# Patient Record
Sex: Male | Born: 1995 | Race: Black or African American | Hispanic: No | Marital: Single | Smoking: Never smoker
Health system: Southern US, Community
[De-identification: ages and names within clinical notes are randomized; demographics above are authoritative.]

---
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
## 2013-05-07 ENCOUNTER — Telehealth: Payer: Self-pay | Admitting: Pediatrics

## 2013-05-07 NOTE — Telephone Encounter (Signed)
Pt would like to know if you will accept him as a pt. Mom and dad are both pt's of yours.

## 2013-05-07 NOTE — Telephone Encounter (Signed)
Sorry but I am too full to take him on

## 2013-05-10 NOTE — Telephone Encounter (Signed)
Mom aware.

## 2013-06-04 ENCOUNTER — Ambulatory Visit: Payer: Commercial Managed Care - PPO | Admitting: Family Medicine

## 2015-10-09 ENCOUNTER — Encounter (HOSPITAL_COMMUNITY): Payer: Self-pay | Admitting: Emergency Medicine

## 2015-10-09 ENCOUNTER — Emergency Department (HOSPITAL_COMMUNITY)
Admission: EM | Admit: 2015-10-09 | Discharge: 2015-10-09 | Disposition: A | Discharge status: Home / Self Care | Payer: Commercial Managed Care - PPO | Attending: Emergency Medicine | Admitting: Emergency Medicine

## 2015-10-09 DIAGNOSIS — R45851 Suicidal ideations: Secondary | ICD-10-CM | POA: Diagnosis present

## 2015-10-09 DIAGNOSIS — Z046 Encounter for general psychiatric examination, requested by authority: Secondary | ICD-10-CM | POA: Insufficient documentation

## 2015-10-09 DIAGNOSIS — Z008 Encounter for other general examination: Secondary | ICD-10-CM

## 2015-10-09 MED ORDER — ZOLPIDEM TARTRATE 5 MG PO TABS: 5.0000 mg | ORAL_TABLET | Freq: Every evening | ORAL | Status: DC | PRN

## 2015-10-09 MED ORDER — AMPHETAMINE-DEXTROAMPHETAMINE 10 MG PO TABS: 10.0000 mg | ORAL_TABLET | Freq: Every day | ORAL | Status: DC

## 2015-10-09 MED ORDER — ONDANSETRON HCL 4 MG PO TABS: 4.0000 mg | ORAL_TABLET | Freq: Three times a day (TID) | ORAL | Status: DC | PRN

## 2015-10-09 MED ORDER — LORAZEPAM 1 MG PO TABS: 1.0000 mg | ORAL_TABLET | Freq: Three times a day (TID) | ORAL | Status: DC | PRN

## 2015-10-09 MED ORDER — ACETAMINOPHEN 325 MG PO TABS: 650.0000 mg | ORAL_TABLET | ORAL | Status: DC | PRN

## 2015-10-09 MED ORDER — ALUM & MAG HYDROXIDE-SIMETH 200-200-20 MG/5ML PO SUSP: 30.0000 mL | ORAL | Status: DC | PRN

## 2015-10-09 MED ORDER — IBUPROFEN 200 MG PO TABS: 600.0000 mg | ORAL_TABLET | Freq: Three times a day (TID) | ORAL | Status: DC | PRN

## 2015-10-09 NOTE — ED Provider Notes (Signed)
[facility] Provider Note   CSN: [id number] Arrival date & time: 10/09/15  2053     History   Chief Complaint Chief Complaint  Patient presents with  . Medical Clearance    HPI James Murray is a 20 y.o. male.  The history is provided by the patient.  He was brought in by police because of concerns of bouts with suicidal ideation. He came in voluntarily and is not under IVC. Apparently, someone saw an email he had written which seem to suggest that he was having suicidal thoughts. Patient states that he was momentarily under stress and wrote something that could've been construed have by someone not familiar with him as indicating suicidal thoughts. There was never any suicidal plan identified. Patient states that this was just written under this is strain of the moment and he never actually had suicidal thoughts. He denies depression. He denies of crying spells, early morning awakening, anhedonia. He denies hallucinations. He has no history of depression.  History reviewed. No pertinent past medical history.  There are no active problems to display for this patient.   History reviewed. No pertinent surgical history.     Home Medications    Prior to Admission medications   Medication Sig Start Date End Date Taking? Authorizing Provider  amphetamine-dextroamphetamine (ADDERALL) 10 MG tablet Take 1 tablet by mouth daily. 07/09/15  Yes Historical Provider, MD    Family History No family history on file.  Social History Social History  Substance Use Topics  . Smoking status: Never Smoker  . Smokeless tobacco: Never Used  . Alcohol use Yes     Comment: rarely     Allergies   Review of patient's allergies indicates no known allergies.   Review of Systems Review of Systems  All other systems reviewed and are negative.    Physical Exam Updated Vital Signs BP 143/85 (BP Location: Right Arm)   Pulse 77   Temp 98.1 F (36.7 C) (Oral)   Resp 16    Ht 5\' 6"  (1.676 m)   Wt 160 lb (72.6 kg)   SpO2 100%   BMI 25.82 kg/m   Physical Exam  Nursing note and vitals reviewed.  20 year old male, resting comfortably and in no acute distress. Vital signs are significant for hypertension. Oxygen saturation is 100%, which is normal. Head is normocephalic and atraumatic. PERRLA, EOMI. Oropharynx is clear. Neck is nontender and supple without adenopathy or JVD. Back is nontender and there is no CVA tenderness. Lungs are clear without rales, wheezes, or rhonchi. Chest is nontender. Heart has regular rate and rhythm without murmur. Abdomen is soft, flat, nontender without masses or hepatosplenomegaly and peristalsis is normoactive. Extremities have no cyanosis or edema, full range of motion is present. Skin is warm and dry without rash. Neurologic: Mental status is normal, cranial nerves are intact, there are no motor or sensory deficits. Psychiatric: Completely normal affect. No evidence of depression.  ED Treatments / Results   Procedures Procedures (including critical care time)  Medications Ordered in ED Medications - No data to display   Initial Impression / Assessment and Plan / ED Course  I have reviewed the triage vital signs and the nursing notes.   Clinical Course    Patient brought in for evaluation for possible suicidal ideation. Exam shows completely normal mental status. No evidence of depression or suicidal thoughts. He is discharged with instructions to follow-up with PCP.  Final Clinical Impressions(s) / ED Diagnoses   Final  diagnoses:  Encounter for psychological evaluation    New Prescriptions Discharge Medication List as of 10/09/2015 11:21 PM       Dione Boozeavid Jager Koska, MD 10/09/15 [phone]

## 2015-10-09 NOTE — ED Triage Notes (Signed)
Pt here with [employer] following writing a letter expressing SI to a student center. The student center contacted [employer] who brought the pt here. Pt denies SI, and HI and denies AVH. The pt states that he over reacted and is calm now after playing video games and taking some time. Pt has no mental health history and does not see a therapist

## 2018-03-18 ENCOUNTER — Other Ambulatory Visit: Payer: Self-pay

## 2018-03-18 ENCOUNTER — Encounter (HOSPITAL_COMMUNITY): Payer: Self-pay | Admitting: Emergency Medicine

## 2018-03-18 ENCOUNTER — Ambulatory Visit (HOSPITAL_COMMUNITY)
Admission: EM | Admit: 2018-03-18 | Discharge: 2018-03-18 | Disposition: A | Discharge status: Home / Self Care | Payer: Commercial Managed Care - PPO

## 2018-03-18 DIAGNOSIS — J029 Acute pharyngitis, unspecified: Secondary | ICD-10-CM | POA: Insufficient documentation

## 2018-03-18 LAB — POCT RAPID STREP A: STREPTOCOCCUS, GROUP A SCREEN (DIRECT): NEGATIVE

## 2018-03-18 MED ORDER — AMOXICILLIN 500 MG PO TABS: 500.0000 mg | ORAL_TABLET | Freq: Two times a day (BID) | ORAL | 0 refills | Status: AC

## 2018-03-18 NOTE — ED Provider Notes (Signed)
[facility]    CSN: [id number] Arrival date & time: 03/18/18  1145     History   Chief Complaint Chief Complaint  Patient presents with  . Sore Throat    HPI James Murray is a 23 y.o. male.   23 year old male presents with a sore throat x4 days.  Patient states initially he had a sore throat last week with a low-grade fever that self resolved.  Patient states 4 days ago his sore throat has returned and is a persistent and worsening in nature.  Patient denies any upper respiratory infection or nausea vomiting or diarrhea.  Condition is made better by nothing.  Condition is made worse by nothing.  Patient denies any relief from over-the-counter cold and flu medicines.     History reviewed. No pertinent past medical history.  There are no active problems to display for this patient.   Past Surgical History:  Procedure Laterality Date  . FINGER SURGERY Right 2013       Home Medications    Prior to Admission medications   Medication Sig Start Date End Date Taking? Authorizing Provider  aspirin 500 MG buffered tablet Take 500 mg by mouth every 6 (six) hours as needed for pain.   Yes [provider]    Family History History reviewed. No pertinent family history.  Social History Social History   Tobacco Use  . Smoking status: Never Smoker  . Smokeless tobacco: Never Used  Substance Use Topics  . Alcohol use: Yes    Comment: rarely  . Drug use: Not on file     Allergies   Patient has no known allergies.   Review of Systems Review of Systems  Constitutional: Positive for fever. Negative for chills.  HENT: Positive for sore throat. Negative for ear pain.   Eyes: Negative for pain and visual disturbance.  Respiratory: Negative for cough and shortness of breath.   Cardiovascular: Negative for chest pain and palpitations.  Gastrointestinal: Negative for abdominal pain and vomiting.  Genitourinary: Negative for dysuria and  hematuria.  Musculoskeletal: Negative for arthralgias and back pain.  Skin: Negative for color change and rash.  Neurological: Negative for seizures and syncope.  All other systems reviewed and are negative.    Physical Exam Triage Vital Signs ED Triage Vitals  Enc Vitals Group     BP 03/18/18 1322 (!) 170/79     Pulse Rate 03/18/18 1322 74     Resp 03/18/18 1322 16     Temp 03/18/18 1322 98.8 F (37.1 C)     Temp Source 03/18/18 1322 Oral     SpO2 03/18/18 1322 100 %     Weight --      Height --      Head Circumference --      Peak Flow --      Pain Score 03/18/18 1320 6     Pain Loc --      Pain Edu? --      Excl. in GC? --    No data found.  Updated Vital Signs BP (!) 170/79 (BP Location: Right Arm)   Pulse 74   Temp 98.8 F (37.1 C) (Oral)   Resp 16   SpO2 100%   Visual Acuity Right Eye Distance:   Left Eye Distance:   Bilateral Distance:    Right Eye Near:   Left Eye Near:    Bilateral Near:     Physical Exam Vitals signs and nursing note reviewed.  Constitutional:  Appearance: He is well-developed.  HENT:     Head: Normocephalic.     Mouth/Throat:     Pharynx: Pharyngeal swelling, oropharyngeal exudate and posterior oropharyngeal erythema present.  Neck:     Musculoskeletal: Normal range of motion.  Pulmonary:     Effort: Pulmonary effort is normal.  Musculoskeletal: Normal range of motion.  Skin:    General: Skin is dry.  Neurological:     Mental Status: He is alert and oriented to person, place, and time.      UC Treatments / Results  Labs (all labs ordered are listed, but only abnormal results are displayed) Labs Reviewed - No data to display  EKG None  Radiology No results found.  Procedures Procedures (including critical care time)  Medications Ordered in UC Medications - No data to display  Initial Impression / Assessment and Plan / UC Course  I have reviewed the triage vital signs and the nursing  notes.  Pertinent labs & imaging results that were available during my care of the patient were reviewed by me and considered in my medical decision making (see chart for details).      Final Clinical Impressions(s) / UC Diagnoses   Final diagnoses:  None   Discharge Instructions   None    ED Prescriptions    None     Controlled Substance Prescriptions [place] Controlled Substance Registry consulted? Not Applicable   Alene Mires, NP 03/18/18 1347

## 2018-03-18 NOTE — Discharge Instructions (Addendum)
Continue to push fluids and take over the counter medications as directed on the back of the box for symptomatic relief.  ° °

## 2018-03-18 NOTE — ED Triage Notes (Signed)
The patient presented to the [facility] with a complaint of a sore throat x 4 days. The patient denied any known fever.
